# Patient Record
Sex: Female | Born: 1965 | Race: Black or African American | Hispanic: No | Marital: Married | Smoking: Never smoker
Health system: Southern US, Community
[De-identification: ages and names within clinical notes are randomized; demographics above are authoritative.]

## PROBLEM LIST (undated history)

## (undated) DIAGNOSIS — G459 Transient cerebral ischemic attack, unspecified: Secondary | ICD-10-CM

## (undated) DIAGNOSIS — R112 Nausea with vomiting, unspecified: Secondary | ICD-10-CM

## (undated) DIAGNOSIS — Z9889 Other specified postprocedural states: Secondary | ICD-10-CM

## (undated) HISTORY — PX: WISDOM TOOTH EXTRACTION: SHX21

## (undated) HISTORY — DX: Other specified postprocedural states: Z98.890

## (undated) HISTORY — DX: Nausea with vomiting, unspecified: R11.2

## (undated) HISTORY — PX: OTHER SURGICAL HISTORY: SHX169

---
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
## 1997-10-23 ENCOUNTER — Inpatient Hospital Stay (HOSPITAL_COMMUNITY): Admission: AD | Admit: 1997-10-23 | Discharge: 1997-10-25 | Payer: Self-pay | Admitting: Obstetrics and Gynecology

## 1997-12-03 ENCOUNTER — Other Ambulatory Visit: Admission: RE | Admit: 1997-12-03 | Discharge: 1997-12-03 | Payer: Self-pay | Admitting: *Deleted

## 1998-12-16 ENCOUNTER — Other Ambulatory Visit: Admission: RE | Admit: 1998-12-16 | Discharge: 1998-12-16 | Payer: Self-pay | Admitting: *Deleted

## 2000-01-16 ENCOUNTER — Encounter (INDEPENDENT_AMBULATORY_CARE_PROVIDER_SITE_OTHER): Payer: Self-pay

## 2000-01-16 ENCOUNTER — Inpatient Hospital Stay (HOSPITAL_COMMUNITY): Admission: AD | Admit: 2000-01-16 | Discharge: 2000-01-18 | Payer: Self-pay | Admitting: Obstetrics & Gynecology

## 2000-03-02 ENCOUNTER — Other Ambulatory Visit: Admission: RE | Admit: 2000-03-02 | Discharge: 2000-03-02 | Payer: Self-pay | Admitting: *Deleted

## 2001-05-04 ENCOUNTER — Other Ambulatory Visit: Admission: RE | Admit: 2001-05-04 | Discharge: 2001-05-04 | Payer: Self-pay | Admitting: *Deleted

## 2002-07-30 ENCOUNTER — Other Ambulatory Visit: Admission: RE | Admit: 2002-07-30 | Discharge: 2002-07-30 | Payer: Self-pay | Admitting: *Deleted

## 2002-08-08 HISTORY — PX: BREAST BIOPSY: SHX20

## 2003-05-23 ENCOUNTER — Encounter (INDEPENDENT_AMBULATORY_CARE_PROVIDER_SITE_OTHER): Payer: Self-pay | Admitting: *Deleted

## 2003-05-23 ENCOUNTER — Encounter: Admission: RE | Admit: 2003-05-23 | Discharge: 2003-05-23 | Payer: Self-pay | Admitting: *Deleted

## 2003-05-23 ENCOUNTER — Encounter: Payer: Self-pay | Admitting: *Deleted

## 2003-08-09 DIAGNOSIS — G459 Transient cerebral ischemic attack, unspecified: Secondary | ICD-10-CM

## 2003-08-09 HISTORY — DX: Transient cerebral ischemic attack, unspecified: G45.9

## 2003-08-18 ENCOUNTER — Other Ambulatory Visit: Admission: RE | Admit: 2003-08-18 | Discharge: 2003-08-18 | Payer: Self-pay | Admitting: *Deleted

## 2003-10-03 ENCOUNTER — Ambulatory Visit (HOSPITAL_COMMUNITY): Admission: RE | Admit: 2003-10-03 | Discharge: 2003-10-03 | Payer: Self-pay | Admitting: *Deleted

## 2003-10-27 ENCOUNTER — Encounter: Admission: RE | Admit: 2003-10-27 | Discharge: 2003-10-27 | Payer: Self-pay | Admitting: Infectious Diseases

## 2004-11-04 ENCOUNTER — Other Ambulatory Visit: Admission: RE | Admit: 2004-11-04 | Discharge: 2004-11-04 | Payer: Self-pay | Admitting: Obstetrics and Gynecology

## 2004-12-14 ENCOUNTER — Encounter: Admission: RE | Admit: 2004-12-14 | Discharge: 2004-12-14 | Payer: Self-pay | Admitting: Obstetrics and Gynecology

## 2004-12-17 ENCOUNTER — Encounter: Admission: RE | Admit: 2004-12-17 | Discharge: 2004-12-17 | Payer: Self-pay | Admitting: Obstetrics and Gynecology

## 2005-02-16 ENCOUNTER — Ambulatory Visit (HOSPITAL_COMMUNITY): Admission: RE | Admit: 2005-02-16 | Discharge: 2005-02-16 | Payer: Self-pay | Admitting: General Surgery

## 2005-02-16 ENCOUNTER — Encounter (INDEPENDENT_AMBULATORY_CARE_PROVIDER_SITE_OTHER): Payer: Self-pay | Admitting: *Deleted

## 2005-02-16 ENCOUNTER — Ambulatory Visit (HOSPITAL_BASED_OUTPATIENT_CLINIC_OR_DEPARTMENT_OTHER): Admission: RE | Admit: 2005-02-16 | Discharge: 2005-02-16 | Payer: Self-pay | Admitting: General Surgery

## 2006-03-17 ENCOUNTER — Encounter: Admission: RE | Admit: 2006-03-17 | Discharge: 2006-03-17 | Payer: Self-pay | Admitting: Obstetrics and Gynecology

## 2006-05-26 ENCOUNTER — Ambulatory Visit (HOSPITAL_COMMUNITY): Admission: RE | Admit: 2006-05-26 | Discharge: 2006-05-26 | Payer: Self-pay | Admitting: Obstetrics and Gynecology

## 2006-05-26 ENCOUNTER — Encounter (INDEPENDENT_AMBULATORY_CARE_PROVIDER_SITE_OTHER): Payer: Self-pay | Admitting: Specialist

## 2007-04-19 ENCOUNTER — Encounter: Admission: RE | Admit: 2007-04-19 | Discharge: 2007-04-19 | Payer: Self-pay | Admitting: Obstetrics and Gynecology

## 2007-08-09 HISTORY — PX: ABDOMINAL HYSTERECTOMY: SHX81

## 2007-12-24 ENCOUNTER — Ambulatory Visit (HOSPITAL_COMMUNITY): Admission: RE | Admit: 2007-12-24 | Discharge: 2007-12-25 | Payer: Self-pay | Admitting: Obstetrics and Gynecology

## 2007-12-24 ENCOUNTER — Encounter (INDEPENDENT_AMBULATORY_CARE_PROVIDER_SITE_OTHER): Payer: Self-pay | Admitting: Obstetrics and Gynecology

## 2008-02-22 ENCOUNTER — Ambulatory Visit (HOSPITAL_COMMUNITY): Admission: RE | Admit: 2008-02-22 | Discharge: 2008-02-22 | Payer: Self-pay | Admitting: Obstetrics and Gynecology

## 2008-08-14 IMAGING — MG MM SCREEN MAMMOGRAM BILATERAL
4 series · 4 of 4 positions shown · non-contrast
Comparison: none

DG SCREEN MAMMOGRAM BILATERAL
Bilateral CC and MLO view(s) were taken.

DIGITAL SCREENING MAMMOGRAM WITH CAD:
The breast tissue is heterogeneously dense.  No masses or malignant type calcifications are 
identified.  Compared with prior studies.

[R CC]
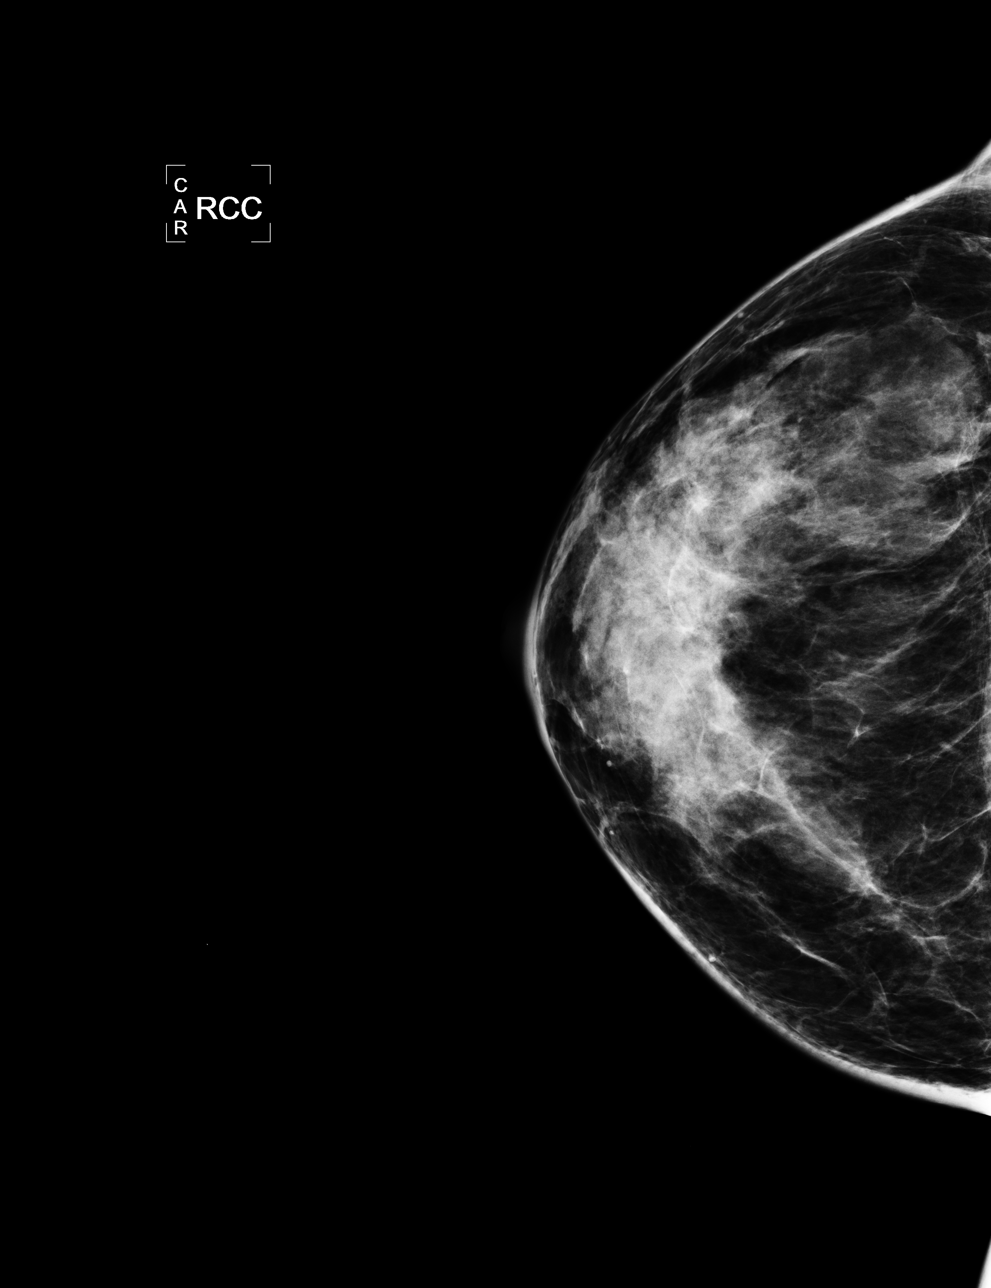

[L CC]
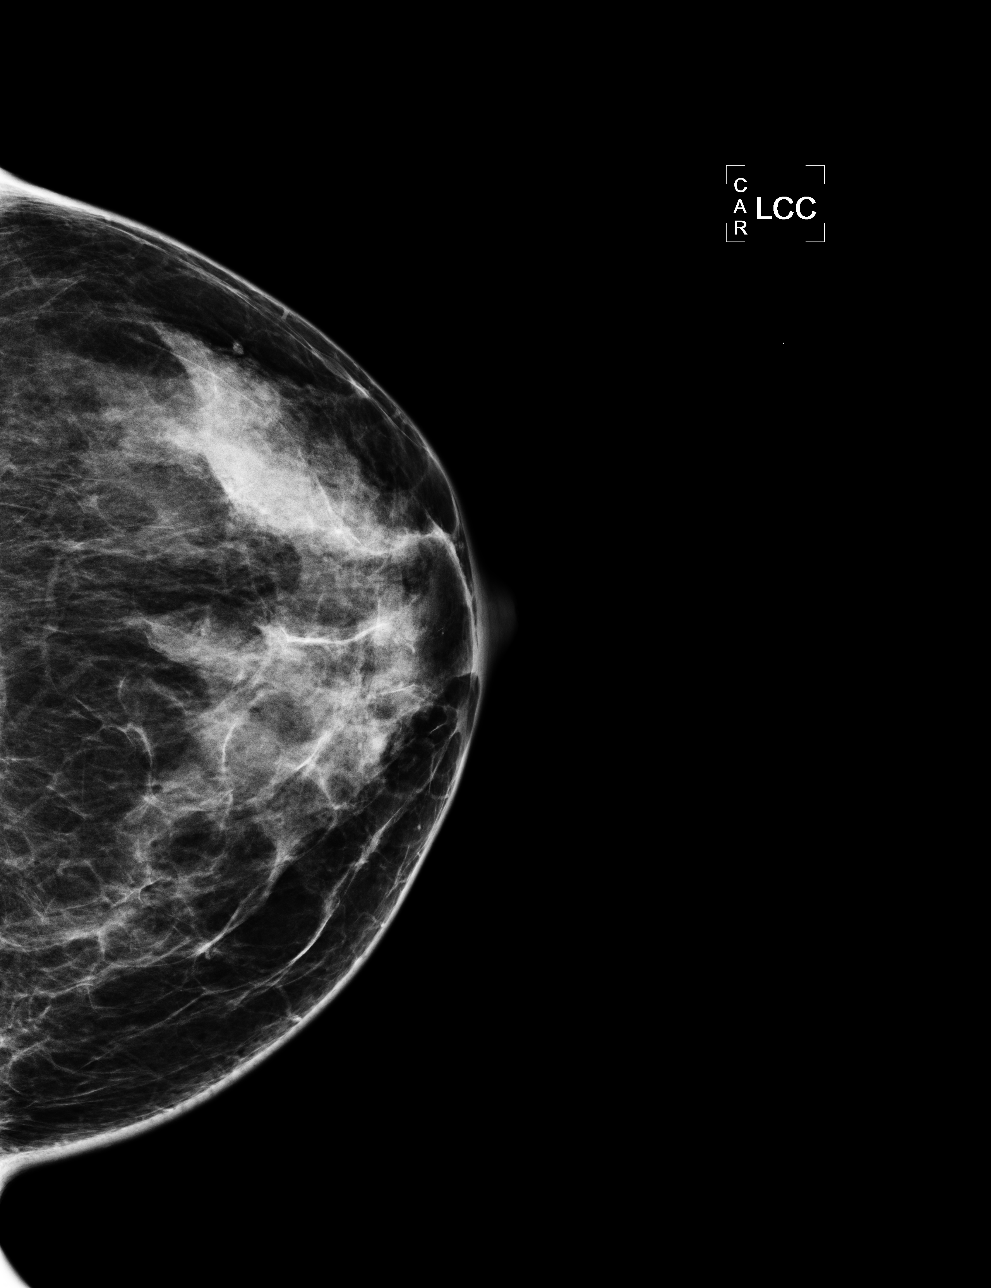

[L MLO]
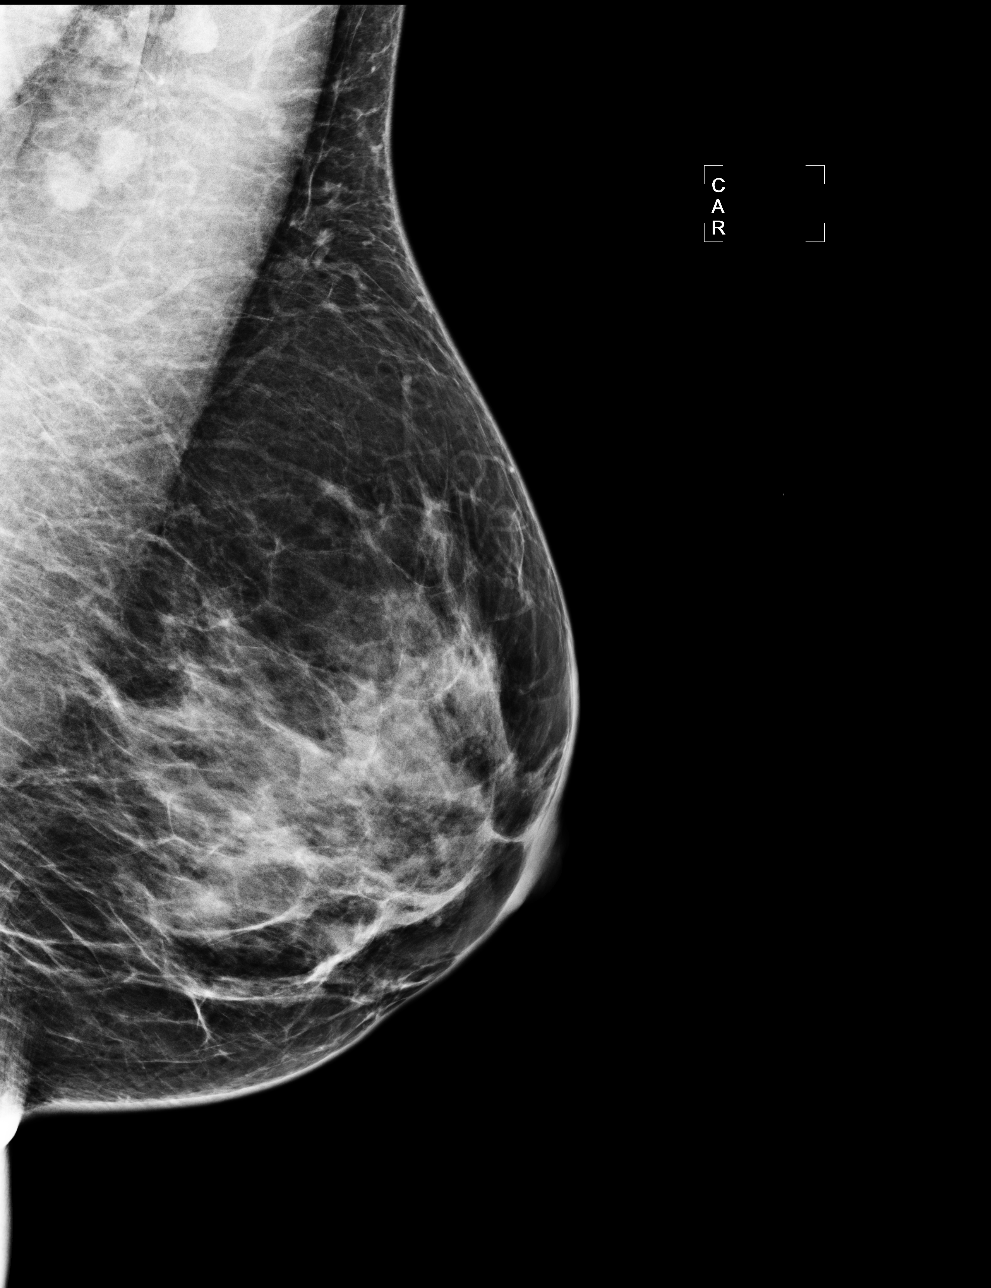

[R MLO]
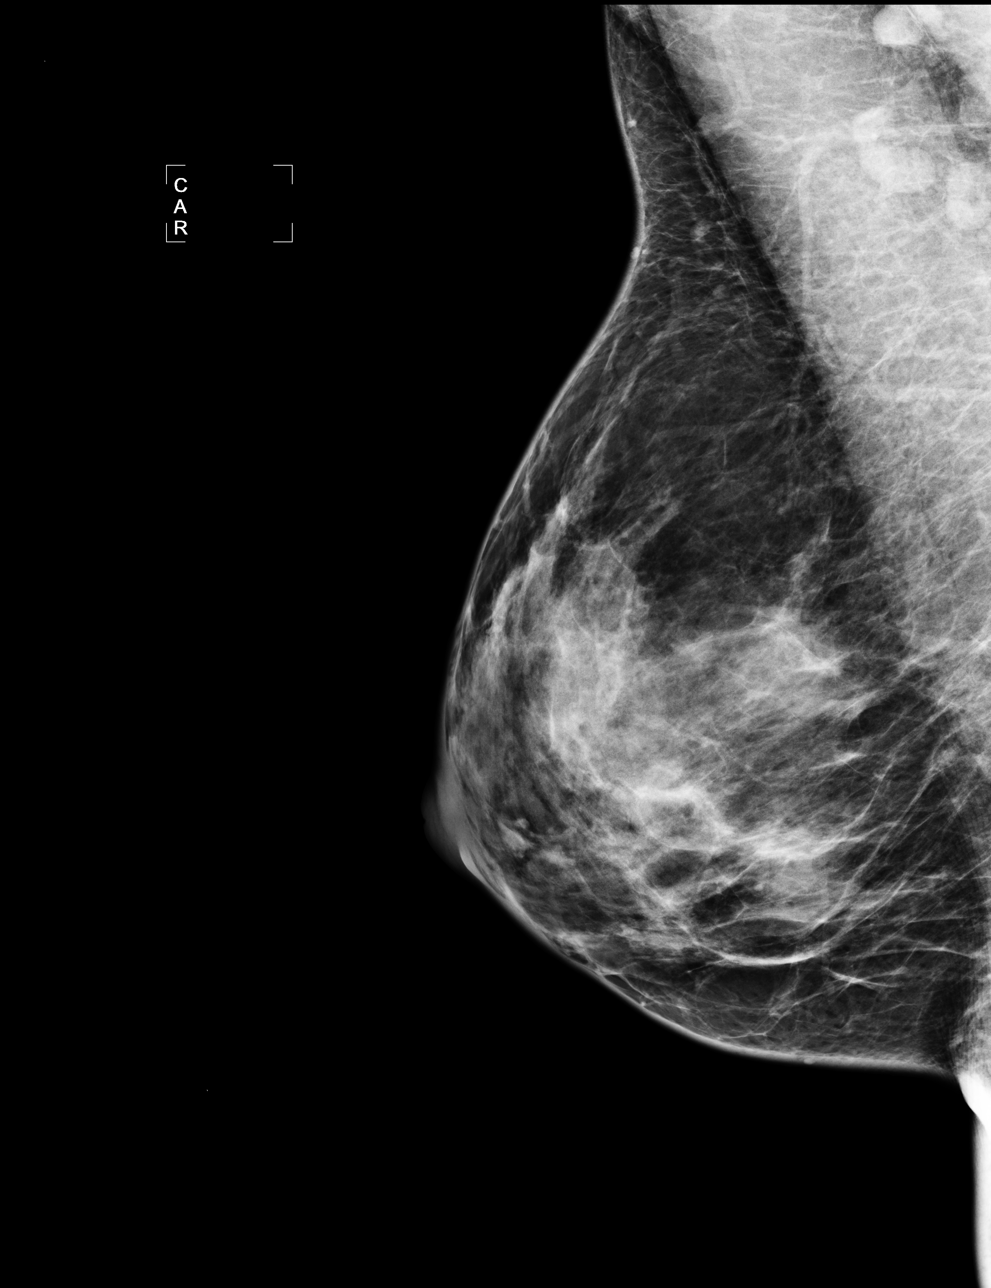

[4 of 4 positions shown; findings below may reference images not displayed]

IMPRESSION: No specific mammographic evidence of malignancy.  Next screening mammogram is recommended in one 
year.

ASSESSMENT: Negative - BI-RADS 1

Screening mammogram in 1 year.
ANALYZED BY COMPUTER AIDED DETECTION. , THIS PROCEDURE WAS A DIGITAL MAMMOGRAM.

## 2009-11-24 ENCOUNTER — Encounter: Admission: RE | Admit: 2009-11-24 | Discharge: 2009-11-24 | Payer: Self-pay | Admitting: Obstetrics and Gynecology

## 2009-12-04 ENCOUNTER — Encounter: Admission: RE | Admit: 2009-12-04 | Discharge: 2009-12-04 | Payer: Self-pay | Admitting: Obstetrics and Gynecology

## 2010-12-21 NOTE — Op Note (Signed)
Susan Underwood, Susan Underwood                 ACCOUNT NO.:  [id number]   MEDICAL RECORD NO.:  [id number]          PATIENT TYPE:  INP   LOCATION:  9318                          FACILITY:  [facility]   PHYSICIAN:  Michelle L. Grewal, M.D.DATE OF BIRTH:  Apr 02, 1966   DATE OF PROCEDURE:  12/24/2007  DATE OF DISCHARGE:                               OPERATIVE REPORT   PREOPERATIVE DIAGNOSES:  1. Symptomatic fibroids.  2. Menorrhagia.  3. Anemia.   POSTOPERATIVE DIAGNOSES:  1. Symptomatic fibroids.  2. Menorrhagia.  3. Anemia.   PROCEDURE:  Laparoscopic-assisted vaginal hysterectomy.   SURGEON:  [name], MD   ASSISTANT:  Guy Sandifer. Henderson Cloud, MD   ANESTHESIA:  General.   FINDINGS:  Enlarged fibroid uterus.   SPECIMENS:  Uterus and cervix sent to pathology.   ESTIMATED BLOOD LOSS:  300 mL.   COMPLICATIONS:  None.   PROCEDURE IN DETAIL:  The patient was taken to the operating room after  she was given informed consent on the risk associated with procedure  which included the risk of anesthesia, risk of bleeding, risk of  infection, risk of internal injury to bowel or bladder or surrounding  organs, risk of pulmonary embolism, thromboembolism.  After the patient  was intubated without difficulty, she was prepped and draped.  A Foley  catheter was inserted and draining clear urine.  A uterine manipulator  was inserted.  Attention was turned to the abdomen.  A small  infraumbilical incision was made after local was infiltrated and a  Veress needle was inserted without difficulty.  Pneumoperitoneum was  performed.  The Veress needle was removed and a 11-mm trocar was  inserted.  The laparoscope was introduced into the abdominal cavity.  No  intestinal injury was noted and no bleeding was noted.  The patient was  gently placed in Trendelenburg position and a 5-mm suprapubic trocar was  inserted.  Exam of the pelvis revealed uterus was markedly enlarged with  a large 7-cm fibroid.  The  ovaries appeared normal.  I then identified  the right tube and ovary, grasped it with an atraumatic grasper,  identified the triple pedicle, placed a gyrus across the triple pedicle,  burned on the right side and carried down through to the round ligament  with excellent hemostasis.  The ureter was well below where the gyrus  was and was peristalsing normally.  This was done in identical fashion  on the left side of the pelvis as well.  The cul-de-sac was clear.  There was no problem in the cul-de-sac.  No adhesions.  The  pneumoperitoneum was released.  The trocars were kept in and then we  turned our attention vaginally.  A weighted speculum was placed in the  vagina.  The cervix was grasped with a tenaculum.  A circumferential  incision was made around the cervix using the Bovie and the posterior  cul-de-sac was entered sharply using Mayo scissors.  A weighted speculum  was placed in the cul-de-sac.  The anterior cul-de-sac was entered  sharply using Mayo scissors.  Using curved Heaney clamps, the  uterosacral  cardinal ligaments were clamped on either side.  Each  pedicle was suture ligated using 0 Vicryl suture.  We walked our way up  to the broad ligament but careful to stay just beside the uterus.  Each  pedicle was suture ligated using 0 Vicryl suture.  Because of the  immense size of the fibroid, once we got almost to the fundus we tried  to deliver the uterus, however, it was too large.  So at this point, I  used the scalpel and the corded out the uterus and removed the large 7  cm fibroid.  When I did do that, the remainder of the uterus came out  quite easily and I clamped the remainder of the broad ligament on the  right with curved Heaney clamps.  The specimen was removed.  The pedicle  was secured using a suture ligature of 0 Vicryl suture.  No bleeding was  noted whatsoever.  The posterior cuff was closed using from 3 to 9  o'clock using 0 Vicryl suture in a running locked  stitch.  We then  closed the cuff completely noting that the peritoneal cavity was  completely dry and using 0 Vicryl anterior to posterior.  At this point,  I went back up to the abdomen after I changed my gloves.  I performed  the pneumoperitoneum again.  The pelvis was gently irrigated using  Nezhat irrigator.  The entire pelvis was clean.  The sigmoid colon,  however, did seemed like it wanted to rest against the vaginal cuff.  So, I did place one small piece of Interceed across the vaginal cuff to  hopefully prevent adhesion formation in the sigmoid colon to the vaginal  cuff.  The cuff was completely dry.  The trocars were removed.  The skin  was closed with Dermabond skin adhesive.  All sponge, lap, and  instrument counts were correct x2.  The patient went to the recovery  room in stable condition.      [name], M.D.  Electronically Signed     MLG/MEDQ  D:  12/24/2007  T:  12/24/2007  Job:  [id number]

## 2010-12-24 NOTE — Op Note (Signed)
[name], [name]                 ACCOUNT NO.:  [id number]   MEDICAL RECORD NO.:  [id number]          PATIENT TYPE:  AMB   LOCATION:  SDC                           FACILITY:  [facility]   PHYSICIAN:  Michelle L. Grewal, M.D.DATE OF BIRTH:  12/21/1965   DATE OF PROCEDURE:  05/26/2006  DATE OF DISCHARGE:                                 OPERATIVE REPORT   PREOPERATIVE DIAGNOSIS:  Endometrial polyp and menorrhagia.   POSTOPERATIVE DIAGNOSES:  Endometrial polyp and menorrhagia.  Submucosal  fibroid.   PROCEDURE:  Diagnostic hysteroscopy, dilatation and curettage.   SURGEON:  Dr. Vincente Poli   ANESTHESIA:  MAC with local.   SPECIMENS:  Uterine curettings.   ESTIMATED BLOOD LOSS:  Minimal.   COMPLICATIONS:  None.   PROCEDURE:  The patient is taken to the operating room.  She is given  sedation.  She is placed in the lithotomy position.  She is prepped and  draped in the usual sterile fashion.  In-and-out catheter is used to empty  the bladder.  Speculum is inserted to the vagina after sterile drape is  applied and cervix is grasped with a tenaculum.  A paracervical block is  performed in standard fashion.  The cervical internal os is dilated.  The  diagnostic hysteroscope inserted to the uterus and with excellent  visualization wee were able to see the following.  She did have some  polypoid area in the uterus, however, the anterior surface of the uterine  cavity was smooth.  Both tubal ostia were seen.  The entire back wall of the  uterus I\was encompassed by the submucosal fibroids that actually bulged  into the uterine cavity.  It was much too large to be resected by  hysteroscopy.  I removed the hysteroscope and then performed a sharp uterine  curettage and then used polyp forceps to remove the remainder of the tissue.  All tissue was sent to pathology for analysis.  All instruments were removed  from the vagina.  All sponge, lap and instrument counts were correct x2.  The patient went  to recovery room in stable condition.      [name], M.D.  Electronically Signed     MLG/MEDQ  D:  05/26/2006  T:  05/28/2006  Job:  [id number]

## 2010-12-24 NOTE — Op Note (Signed)
NAMEIDALIA, ALLBRITTON                 ACCOUNT NO.:  [id number]   MEDICAL RECORD NO.:  [id number]          PATIENT TYPE:  AMB   LOCATION:  DSC                          FACILITY:  [facility]   PHYSICIAN:  Rose Phi. Maple Hudson, M.D.   DATE OF BIRTH:  [birth year]   DATE OF PROCEDURE:  02/16/2005  DATE OF DISCHARGE:                                 OPERATIVE REPORT   PREOPERATIVE DIAGNOSIS:  Right breast mass.   POSTOPERATIVE DIAGNOSIS:  Right breast mass.   OPERATION PERFORMED:  Excision of right breast mass.   SURGEON:  Rose Phi. Maple Hudson, M.D.   ANESTHESIA:  MAC.   DESCRIPTION OF PROCEDURE:  The patient was placed on the operating table  with the arms extended on the arm board and the right breast prepped and  draped in the usual fashion.  The palpable mass was at about the 7 o'clock  position of the right breast near the areolar margin.  A little transverse  incision was outlined over it and the area infiltrated with a local  anesthetic mixture.  The incision was made and I could palpate the lumb and  so we excised it with a little surrounding breast tissue.  Hemostasis was  obtained with the cautery.  Subcuticular closure with 4-0 Monocryl and Steri-  Strips carried out.  Specimens sent to the pathology lab.   A dressing was applied and the patient transferred to the recovery room in  satisfactory condition having tolerated the procedure well.       PRY/MEDQ  D:  02/16/2005  T:  02/16/2005  Job:  [id number]

## 2010-12-28 ENCOUNTER — Other Ambulatory Visit: Payer: Self-pay | Admitting: Obstetrics and Gynecology

## 2010-12-28 DIAGNOSIS — Z1231 Encounter for screening mammogram for malignant neoplasm of breast: Secondary | ICD-10-CM

## 2010-12-31 ENCOUNTER — Ambulatory Visit
Admission: RE | Admit: 2010-12-31 | Discharge: 2010-12-31 | Disposition: A | Discharge status: Home / Self Care | Payer: BC Managed Care – PPO | Source: Ambulatory Visit | Attending: Obstetrics and Gynecology | Admitting: Obstetrics and Gynecology

## 2010-12-31 ENCOUNTER — Other Ambulatory Visit: Payer: Self-pay | Admitting: Obstetrics and Gynecology

## 2010-12-31 DIAGNOSIS — N63 Unspecified lump in unspecified breast: Secondary | ICD-10-CM

## 2010-12-31 DIAGNOSIS — Z1231 Encounter for screening mammogram for malignant neoplasm of breast: Secondary | ICD-10-CM

## 2011-01-07 ENCOUNTER — Other Ambulatory Visit: Payer: Self-pay | Admitting: Obstetrics and Gynecology

## 2011-01-07 ENCOUNTER — Ambulatory Visit
Admission: RE | Admit: 2011-01-07 | Discharge: 2011-01-07 | Disposition: A | Discharge status: Home / Self Care | Payer: BC Managed Care – PPO | Source: Ambulatory Visit | Attending: Obstetrics and Gynecology | Admitting: Obstetrics and Gynecology

## 2011-01-07 DIAGNOSIS — N63 Unspecified lump in unspecified breast: Secondary | ICD-10-CM

## 2011-04-01 IMAGING — MG MM DIGITAL DIAG LTD R
2 series · 2 of 2 positions shown · non-contrast
Comparison: [DATE] [DATE], [DATE], [DATE] [DATE], [DATE]

CLINICAL DATA: Called back from screening mammogram for possible
mass right breast

DIGITAL DIAGNOSTIC RIGHT MAMMOGRAM December 04, 2009 AND RIGHT BREAST
ULTRASOUND:

[R CC]
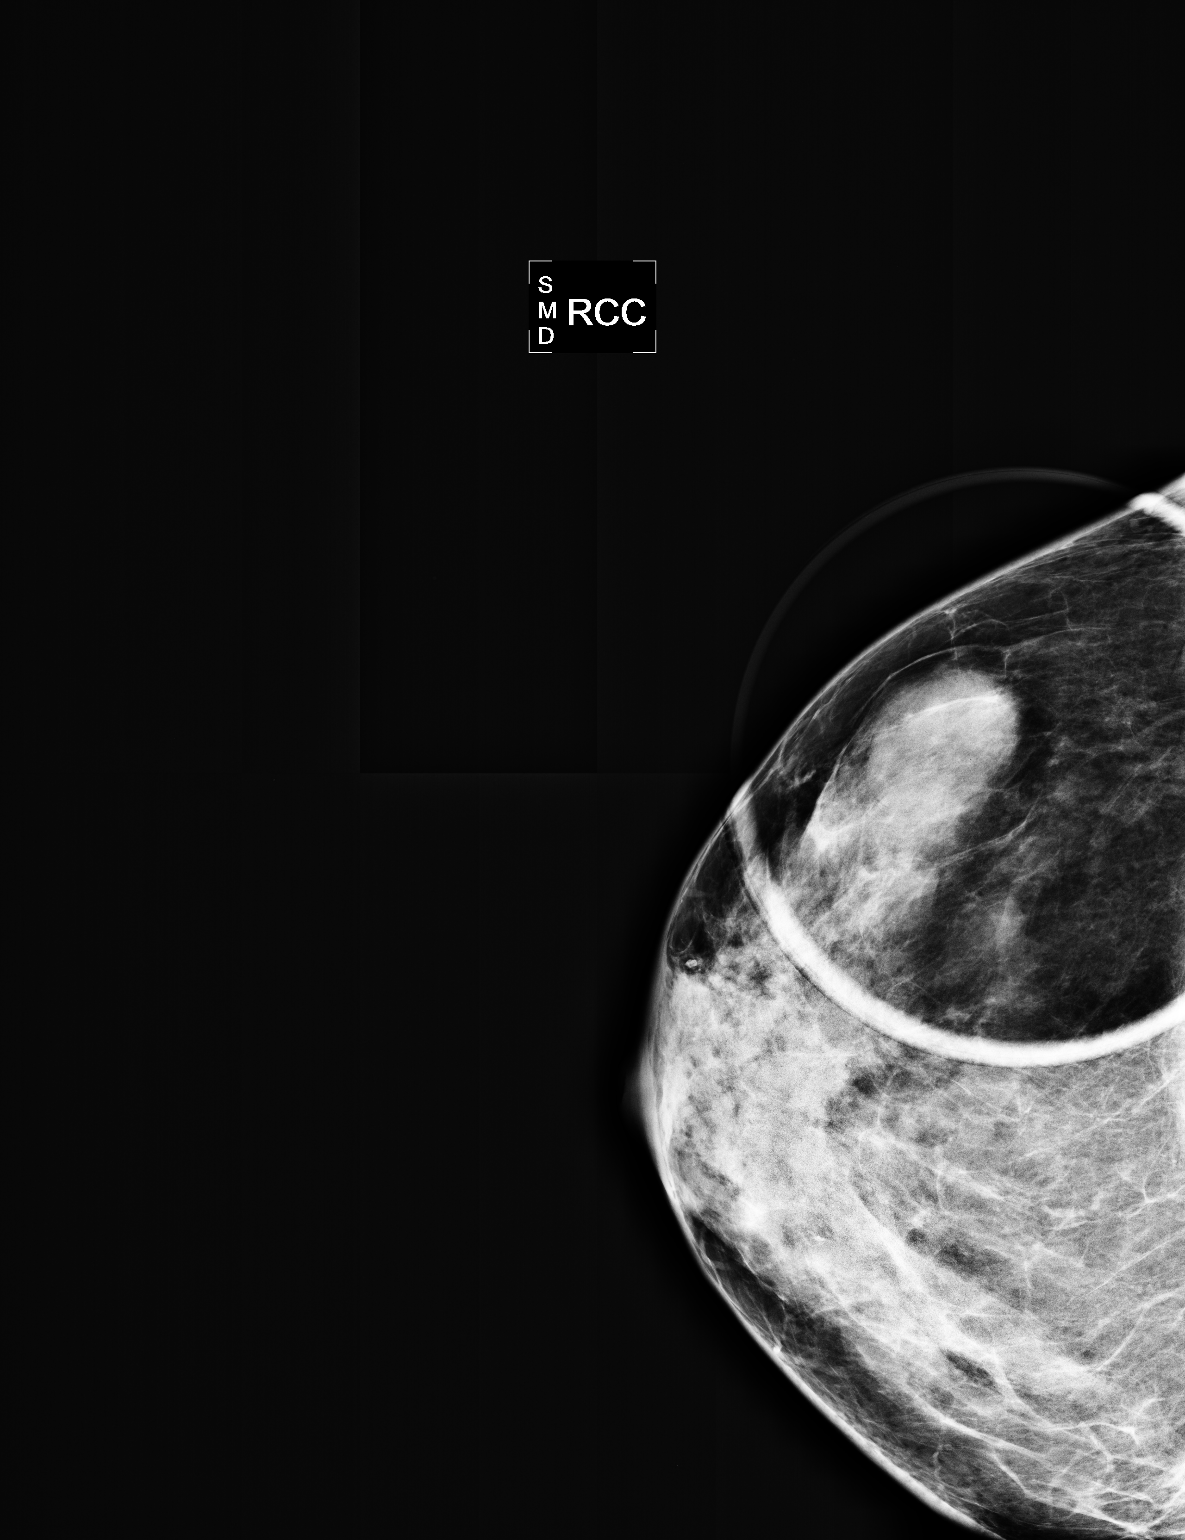

[R MLO]
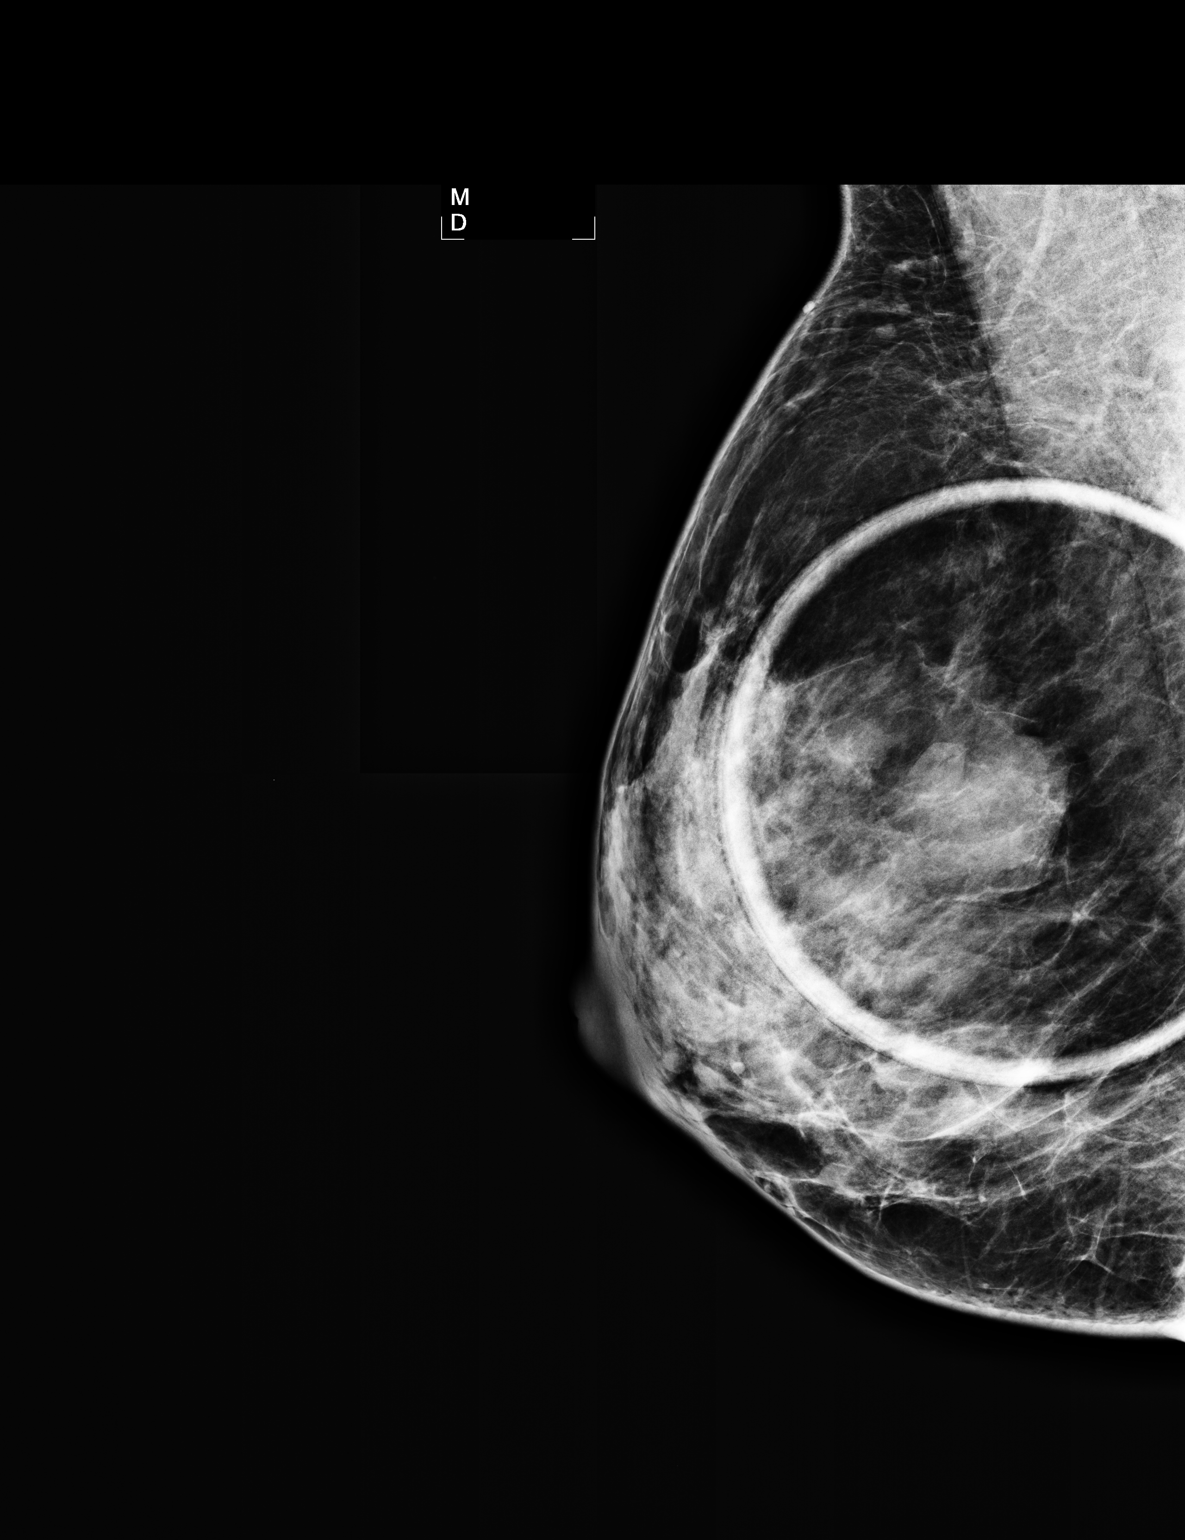

[2 of 2 positions shown; findings below may reference images not displayed]

FINDINGS: Spot compression CC and MLO views of the right breast
are submitted.  The previously questioned mass in the right breast
persists; there is a small adjacent asymmetry.

On physical exam, I palpate a 2 cm mass at the right breast nine
o'clock position 3 cm from the nipple.

Ultrasound is performed, showing two adjacent simple cysts at the
right breast nine o'clock position; the larger measures 2 cm in
maximum dimension.  These correlate to the mammographic finding.
IMPRESSION: Benign findings, recommend routine screening mammogram back on
schedule

BI-RADS CATEGORY 2:  Benign finding(s).

## 2011-04-01 IMAGING — US UNKNOWN US STUDY
1 series · 4 of 4 positions shown · non-contrast
Comparison: [DATE] [DATE], [DATE], [DATE] [DATE], [DATE]

CLINICAL DATA: Called back from screening mammogram for possible
mass right breast

DIGITAL DIAGNOSTIC RIGHT MAMMOGRAM December 04, 2009 AND RIGHT BREAST
ULTRASOUND:

[Series 1: unknown us study · 4 of 4 slices shown]
[im 1/4]
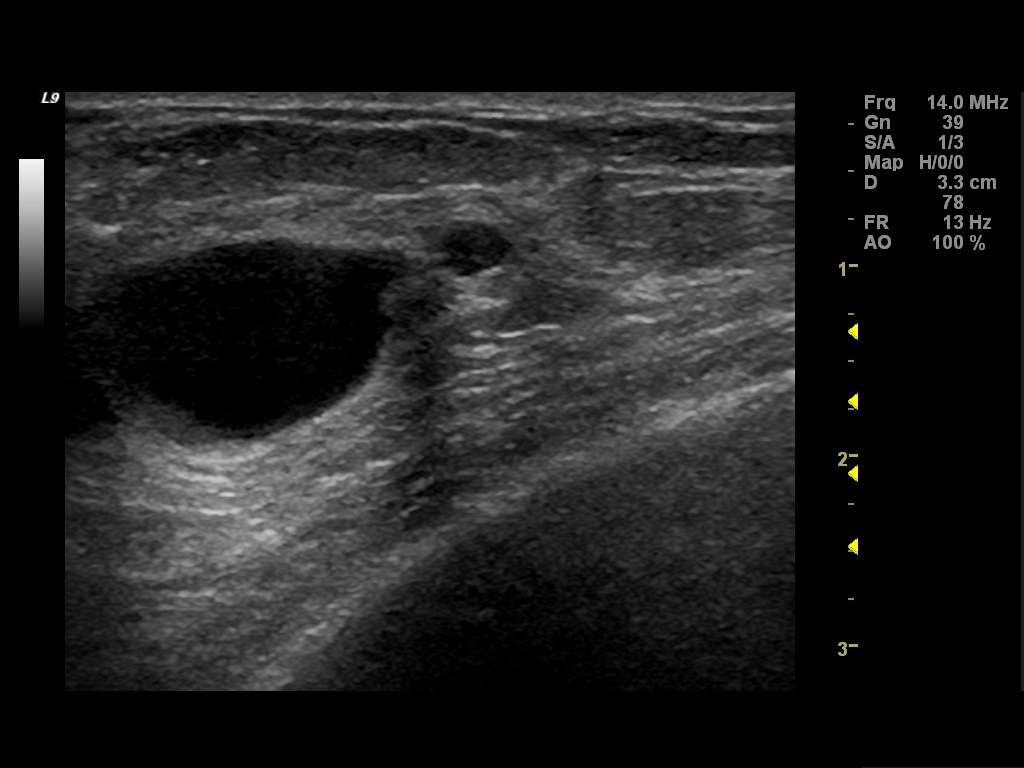
[im 2/4]
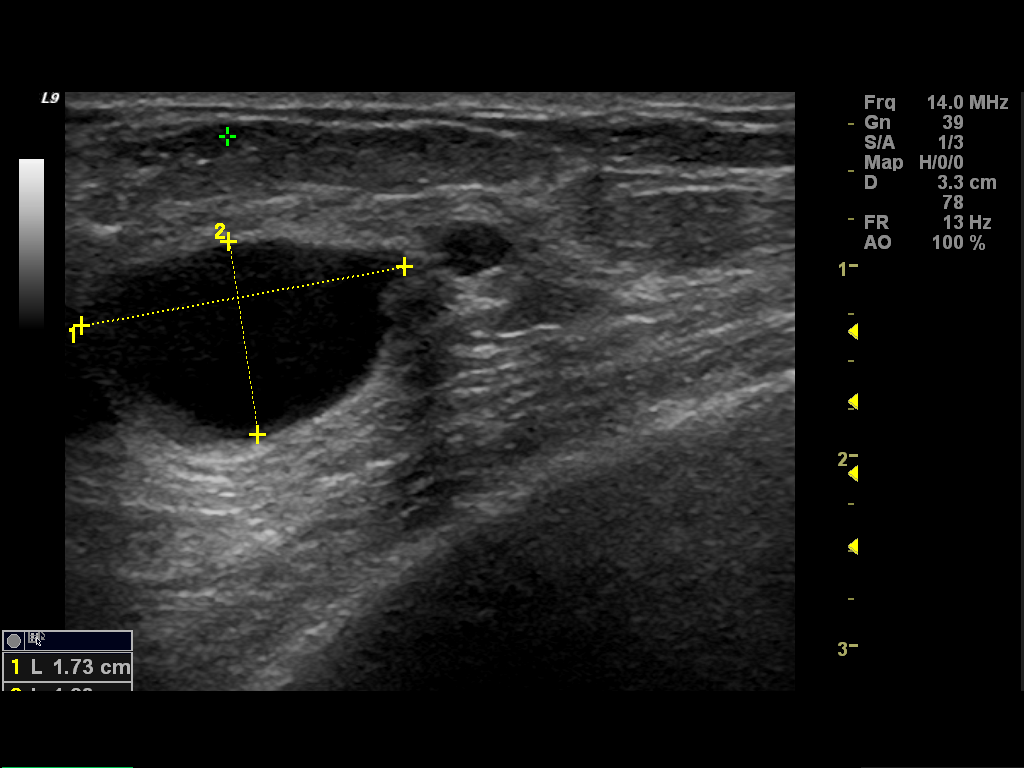
[im 3/4]
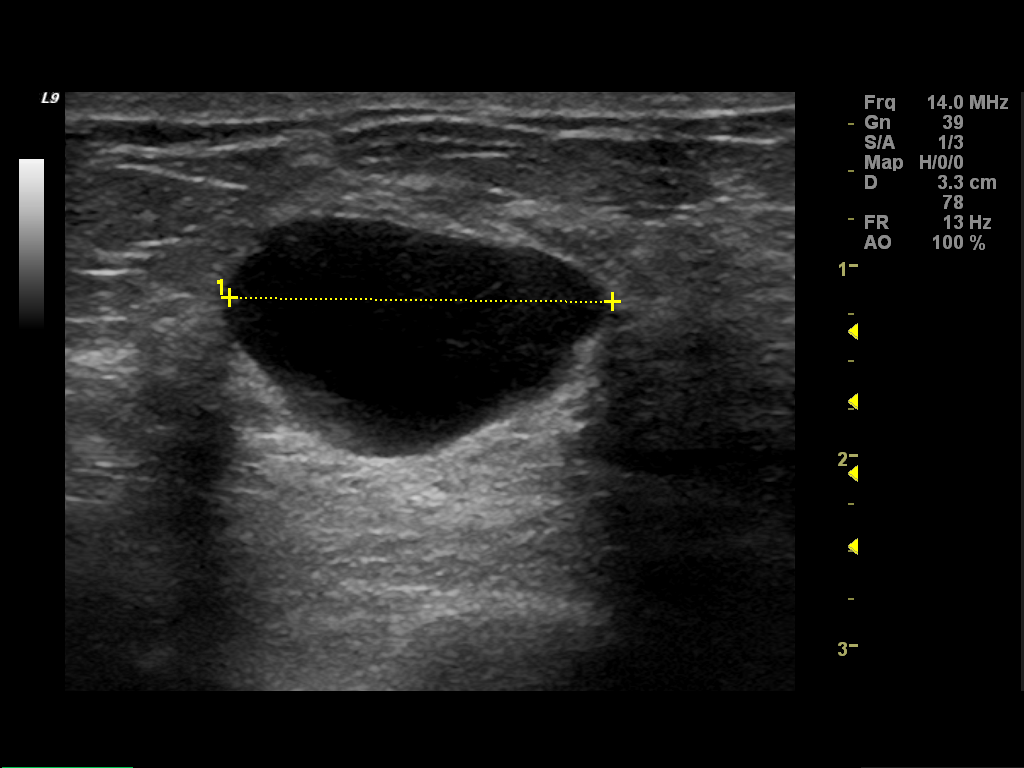
[im 4/4]
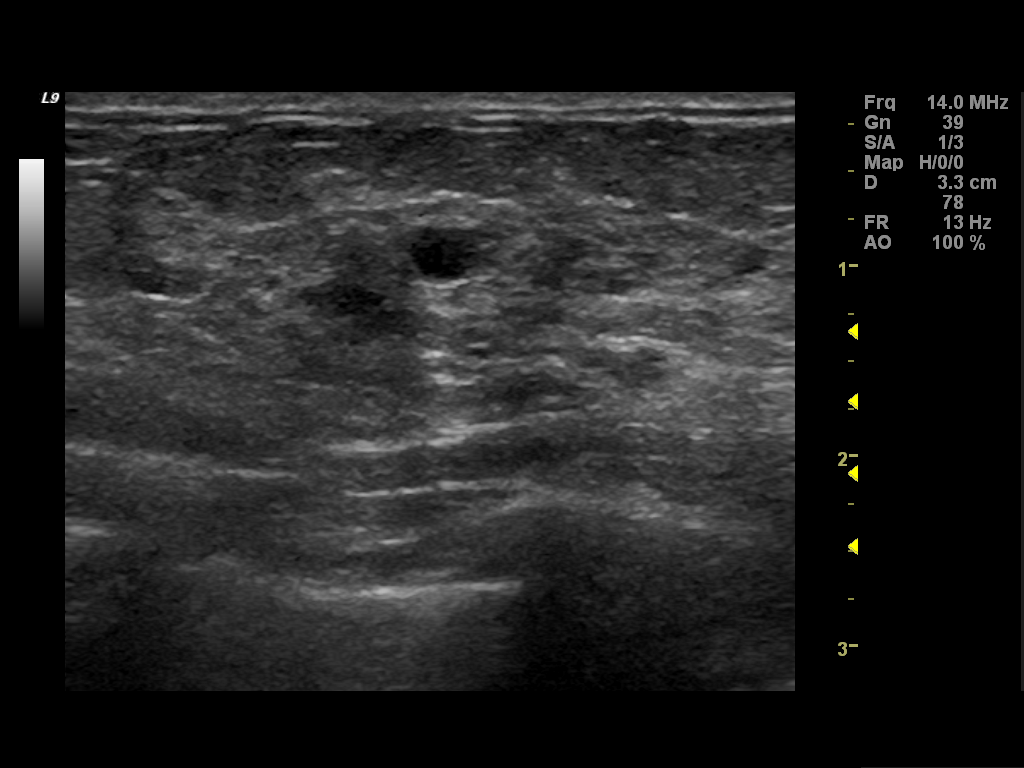

[4 of 4 positions shown; findings below may reference images not displayed]

FINDINGS: Spot compression CC and MLO views of the right breast
are submitted.  The previously questioned mass in the right breast
persists; there is a small adjacent asymmetry.

On physical exam, I palpate a 2 cm mass at the right breast nine
o'clock position 3 cm from the nipple.

Ultrasound is performed, showing two adjacent simple cysts at the
right breast nine o'clock position; the larger measures 2 cm in
maximum dimension.  These correlate to the mammographic finding.
IMPRESSION: Benign findings, recommend routine screening mammogram back on
schedule

BI-RADS CATEGORY 2:  Benign finding(s).

## 2011-05-04 LAB — CBC
HCT: 27.8 — ABNORMAL LOW
Hemoglobin: 9.3 — ABNORMAL LOW
MCHC: 33.6
Platelets: 190
RBC: 3.2 — ABNORMAL LOW
WBC: 9.4

## 2012-01-16 ENCOUNTER — Other Ambulatory Visit: Payer: Self-pay | Admitting: Obstetrics and Gynecology

## 2012-01-16 DIAGNOSIS — Z1231 Encounter for screening mammogram for malignant neoplasm of breast: Secondary | ICD-10-CM

## 2012-01-20 ENCOUNTER — Ambulatory Visit
Admission: RE | Admit: 2012-01-20 | Discharge: 2012-01-20 | Disposition: A | Discharge status: Home / Self Care | Payer: BC Managed Care – PPO | Source: Ambulatory Visit | Attending: Obstetrics and Gynecology | Admitting: Obstetrics and Gynecology

## 2012-01-20 DIAGNOSIS — Z1231 Encounter for screening mammogram for malignant neoplasm of breast: Secondary | ICD-10-CM

## 2013-03-27 ENCOUNTER — Other Ambulatory Visit: Payer: Self-pay

## 2013-03-27 DIAGNOSIS — Z1231 Encounter for screening mammogram for malignant neoplasm of breast: Secondary | ICD-10-CM

## 2013-04-26 ENCOUNTER — Ambulatory Visit
Admission: RE | Admit: 2013-04-26 | Discharge: 2013-04-26 | Disposition: A | Discharge status: Home / Self Care | Payer: BC Managed Care – PPO | Source: Ambulatory Visit

## 2013-04-26 DIAGNOSIS — Z1231 Encounter for screening mammogram for malignant neoplasm of breast: Secondary | ICD-10-CM

## 2013-05-10 ENCOUNTER — Other Ambulatory Visit: Payer: Self-pay | Admitting: Obstetrics and Gynecology

## 2013-05-10 DIAGNOSIS — N63 Unspecified lump in unspecified breast: Secondary | ICD-10-CM

## 2013-05-24 ENCOUNTER — Ambulatory Visit
Admission: RE | Admit: 2013-05-24 | Discharge: 2013-05-24 | Disposition: A | Discharge status: Home / Self Care | Payer: BC Managed Care – PPO | Source: Ambulatory Visit | Attending: Obstetrics and Gynecology | Admitting: Obstetrics and Gynecology

## 2013-05-24 DIAGNOSIS — N63 Unspecified lump in unspecified breast: Secondary | ICD-10-CM

## 2013-07-26 ENCOUNTER — Other Ambulatory Visit: Payer: Self-pay | Admitting: Obstetrics and Gynecology

## 2013-07-29 ENCOUNTER — Other Ambulatory Visit: Payer: Self-pay | Admitting: Obstetrics and Gynecology

## 2013-07-29 DIAGNOSIS — N6001 Solitary cyst of right breast: Secondary | ICD-10-CM

## 2013-08-06 ENCOUNTER — Ambulatory Visit
Admission: RE | Admit: 2013-08-06 | Discharge: 2013-08-06 | Disposition: A | Discharge status: Home / Self Care | Payer: BC Managed Care – PPO | Source: Ambulatory Visit | Attending: Obstetrics and Gynecology | Admitting: Obstetrics and Gynecology

## 2013-08-06 DIAGNOSIS — N6001 Solitary cyst of right breast: Secondary | ICD-10-CM

## 2014-06-03 ENCOUNTER — Other Ambulatory Visit: Payer: Self-pay

## 2014-06-03 DIAGNOSIS — Z1231 Encounter for screening mammogram for malignant neoplasm of breast: Secondary | ICD-10-CM

## 2014-06-06 ENCOUNTER — Other Ambulatory Visit: Payer: Self-pay | Admitting: Obstetrics and Gynecology

## 2014-06-06 ENCOUNTER — Ambulatory Visit
Admission: RE | Admit: 2014-06-06 | Discharge: 2014-06-06 | Disposition: A | Discharge status: Home / Self Care | Payer: BC Managed Care – PPO | Source: Ambulatory Visit

## 2014-06-06 ENCOUNTER — Encounter (INDEPENDENT_AMBULATORY_CARE_PROVIDER_SITE_OTHER): Payer: Self-pay

## 2014-06-06 DIAGNOSIS — N63 Unspecified lump in unspecified breast: Secondary | ICD-10-CM

## 2014-06-06 DIAGNOSIS — Z1231 Encounter for screening mammogram for malignant neoplasm of breast: Secondary | ICD-10-CM

## 2014-06-11 ENCOUNTER — Other Ambulatory Visit: Payer: Self-pay | Admitting: Obstetrics and Gynecology

## 2014-06-11 DIAGNOSIS — N63 Unspecified lump in unspecified breast: Secondary | ICD-10-CM

## 2014-06-27 ENCOUNTER — Ambulatory Visit
Admission: RE | Admit: 2014-06-27 | Discharge: 2014-06-27 | Disposition: A | Discharge status: Home / Self Care | Payer: BC Managed Care – PPO | Source: Ambulatory Visit | Attending: Obstetrics and Gynecology | Admitting: Obstetrics and Gynecology

## 2014-06-27 DIAGNOSIS — N63 Unspecified lump in unspecified breast: Secondary | ICD-10-CM

## 2014-07-28 ENCOUNTER — Other Ambulatory Visit: Payer: Self-pay | Admitting: Obstetrics and Gynecology

## 2014-07-29 LAB — CYTOLOGY - PAP

## 2015-08-04 ENCOUNTER — Other Ambulatory Visit: Payer: Self-pay

## 2015-08-04 DIAGNOSIS — Z1231 Encounter for screening mammogram for malignant neoplasm of breast: Secondary | ICD-10-CM

## 2016-08-08 HISTORY — PX: EYE SURGERY: SHX253

## 2016-09-09 ENCOUNTER — Other Ambulatory Visit: Payer: Self-pay | Admitting: Obstetrics and Gynecology

## 2016-09-09 ENCOUNTER — Ambulatory Visit
Admission: RE | Admit: 2016-09-09 | Discharge: 2016-09-09 | Disposition: A | Discharge status: Home / Self Care | Payer: BLUE CROSS/BLUE SHIELD | Source: Ambulatory Visit | Attending: Obstetrics and Gynecology | Admitting: Obstetrics and Gynecology

## 2016-09-09 ENCOUNTER — Other Ambulatory Visit: Payer: Self-pay | Admitting: *Deleted

## 2016-09-09 DIAGNOSIS — H15091 Other scleritis, right eye: Secondary | ICD-10-CM

## 2018-12-25 ENCOUNTER — Encounter: Payer: Self-pay | Admitting: Gastroenterology

## 2019-01-04 ENCOUNTER — Ambulatory Visit: Payer: BLUE CROSS/BLUE SHIELD | Admitting: *Deleted

## 2019-01-04 ENCOUNTER — Other Ambulatory Visit: Payer: Self-pay

## 2019-01-04 ENCOUNTER — Encounter: Payer: Self-pay | Admitting: Gastroenterology

## 2019-01-04 VITALS — Ht 64.0 in | Wt 172.0 lb

## 2019-01-04 DIAGNOSIS — Z1211 Encounter for screening for malignant neoplasm of colon: Secondary | ICD-10-CM

## 2019-01-04 MED ORDER — PEG 3350-KCL-NA BICARB-NACL 420 G PO SOLR: 4000.0000 mL | Freq: Once | ORAL | 0 refills | Status: AC

## 2019-01-04 NOTE — Progress Notes (Signed)
HX PONV--No egg or soy allergy known to patient  No issues with past sedation with any surgeries  or procedures, no intubation problems  No diet pills per patient No home 02 use per patient  No blood thinners per patient  Pt denies issues with constipation  No A fib or A flutter  EMMI video sent to pt's e mail    Pt verified name, DOB, address and insurance during PV today. Pt mailed instruction packet to included paper to complete and mail back to [facility] with addressed and stamped envelope, Emmi video, copy of consent form to read and not return, and instructions.  PV completed over the phone. Pt encouraged to call with questions or issues

## 2019-01-16 ENCOUNTER — Telehealth: Payer: Self-pay | Admitting: *Deleted

## 2019-01-16 NOTE — Telephone Encounter (Signed)

## 2019-01-18 ENCOUNTER — Encounter: Payer: Self-pay | Admitting: Gastroenterology

## 2019-01-18 ENCOUNTER — Other Ambulatory Visit: Payer: Self-pay

## 2019-01-18 ENCOUNTER — Ambulatory Visit (AMBULATORY_SURGERY_CENTER): Payer: BC Managed Care – PPO | Admitting: Gastroenterology

## 2019-01-18 VITALS — BP 138/80 | HR 61 | Temp 97.3°F | Resp 11 | Ht 64.0 in | Wt 172.0 lb

## 2019-01-18 DIAGNOSIS — Z1211 Encounter for screening for malignant neoplasm of colon: Secondary | ICD-10-CM | POA: Diagnosis present

## 2019-01-18 MED ORDER — SODIUM CHLORIDE 0.9 % IV SOLN: 500.0000 mL | Freq: Once | INTRAVENOUS | Status: DC

## 2019-01-18 NOTE — Patient Instructions (Signed)
YOU HAD AN ENDOSCOPIC PROCEDURE TODAY AT THE [facility]:   Refer to the procedure report that was given to you for any specific questions about what was found during the examination.  If the procedure report does not answer your questions, please call your gastroenterologist to clarify.  If you requested that your care partner not be given the details of your procedure findings, then the procedure report has been included in a sealed envelope for you to review at your convenience later.  YOU SHOULD EXPECT: Some feelings of bloating in the abdomen. Passage of more gas than usual.  Walking can help get rid of the air that was put into your GI tract during the procedure and reduce the bloating. If you had a lower endoscopy (such as a colonoscopy or flexible sigmoidoscopy) you may notice spotting of blood in your stool or on the toilet paper. If you underwent a bowel prep for your procedure, you may not have a normal bowel movement for a few days.  Please Note:  You might notice some irritation and congestion in your nose or some drainage.  This is from the oxygen used during your procedure.  There is no need for concern and it should clear up in a day or so.  SYMPTOMS TO REPORT IMMEDIATELY:   Following lower endoscopy (colonoscopy or flexible sigmoidoscopy):  Excessive amounts of blood in the stool  Significant tenderness or worsening of abdominal pains  Swelling of the abdomen that is new, acute  Fever of 100F or higher  For urgent or emergent issues, a gastroenterologist can be reached at any hour by calling [phone].   DIET:  We do recommend a small meal at first, but then you may proceed to your regular diet.  Drink plenty of fluids but you should avoid alcoholic beverages for 24 hours.  ACTIVITY:  You should plan to take it easy for the rest of today and you should NOT DRIVE or use heavy machinery until tomorrow (because of the sedation medicines used during the test).     FOLLOW UP: Our staff will call the number listed on your records 48-72 hours following your procedure to check on you and address any questions or concerns that you may have regarding the information given to you following your procedure. If we do not reach you, we will leave a message.  We will attempt to reach you two times.  During this call, we will ask if you have developed any symptoms of COVID 19. If you develop any symptoms (ie: fever, flu-like symptoms, shortness of breath, cough etc.) before then, please call [phone].  If you test positive for Covid 19 in the 2 weeks post procedure, please call and report this information to us.    If any biopsies were taken you will be contacted by phone or by letter within the next 1-3 weeks.  Please call us at [phone] if you have not heard about the biopsies in 3 weeks.    SIGNATURES/CONFIDENTIALITY: You and/or your care partner have signed paperwork which will be entered into your electronic medical record.  These signatures attest to the fact that that the information above on your After Visit Summary has been reviewed and is understood.  Full responsibility of the confidentiality of this discharge information lies with you and/or your care-partner. 

## 2019-01-18 NOTE — Progress Notes (Signed)
Pt's states no medical or surgical changes since previsit or office visit. 

## 2019-01-18 NOTE — Progress Notes (Signed)
Nancy Campbell, LPN- Temp Judy Branson, CMA- Vitals 

## 2019-01-18 NOTE — Progress Notes (Signed)
Report to PACU, RN, vss, BBS= Clear.  

## 2019-01-18 NOTE — Op Note (Signed)
[facility] Patient Name: Susan Underwood Procedure Date: 01/18/2019 9:54 AM MRN: [id number] Endoscopist: Milus Banister , MD Age: 53 Referring MD:  Date of Birth: [birth year] Gender: Female Account #: [id number] Procedure:                Colonoscopy Indications:              Screening for colorectal malignant neoplasm Medicines:                Monitored Anesthesia Care Procedure:                Pre-Anesthesia Assessment:                           - Prior to the procedure, a History and Physical                            was performed, and patient medications and                            allergies were reviewed. The patient's tolerance of                            previous anesthesia was also reviewed. The risks                            and benefits of the procedure and the sedation                            options and risks were discussed with the patient.                            All questions were answered, and informed consent                            was obtained. Prior Anticoagulants: The patient has                            taken no previous anticoagulant or antiplatelet                            agents. ASA Grade Assessment: II - A patient with                            mild systemic disease. After reviewing the risks                            and benefits, the patient was deemed in                            satisfactory condition to undergo the procedure.                           After obtaining informed consent, the colonoscope  was passed under direct vision. Throughout the                            procedure, the patient's blood pressure, pulse, and                            oxygen saturations were monitored continuously. The                            Colonoscope was introduced through the anus and                            advanced to the the cecum, identified by                            appendiceal orifice and  ileocecal valve. The                            colonoscopy was performed without difficulty. The                            patient tolerated the procedure well. The quality                            of the bowel preparation was excellent. The                            ileocecal valve, appendiceal orifice, and rectum                            were photographed. Scope In: 9:55:44 AM Scope Out: 10:06:51 AM Scope Withdrawal Time: 0 hours 8 minutes 45 seconds  Total Procedure Duration: 0 hours 11 minutes 7 seconds  Findings:                 The entire examined colon appeared normal on direct                            and retroflexion views. Complications:            No immediate complications. Estimated blood loss:                            None. Estimated Blood Loss:     Estimated blood loss: none. Impression:               - The entire examined colon is normal on direct and                            retroflexion views.                           - No polyps or cancers. Recommendation:           - Patient has a contact number available for  emergencies. The signs and symptoms of potential                            delayed complications were discussed with the                            patient. Return to normal activities tomorrow.                            Written discharge instructions were provided to the                            patient.                           - Resume previous diet.                           - Continue present medications.                           - Repeat colonoscopy in 10 years for screening. Rachael Feeaniel P Jacobs, MD 01/18/2019 10:08:50 AM This report has been signed electronically.

## 2019-01-22 ENCOUNTER — Telehealth: Payer: Self-pay | Admitting: *Deleted

## 2019-01-22 NOTE — Telephone Encounter (Signed)
  Follow up Call-  Call back number 01/18/2019  Post procedure Call Back phone  # [phone]  Permission to leave phone message Yes  Some recent data might be hidden     Patient questions:  Do you have a fever, pain , or abdominal swelling? No. Pain Score  0 *  Have you tolerated food without any problems? Yes.    Have you been able to return to your normal activities? Yes.    Do you have any questions about your discharge instructions: Diet   No. Medications  No. Follow up visit  No.  Do you have questions or concerns about your Care? No.  Actions: * If pain score is 4 or above: No action needed, pain <4.  Covid-19 screening questions   Do you now or have you had a fever in the last 14 days? no  Do you have any respiratory symptoms of shortness of breath or cough now or in the last 14 days? no  Do you have any family members or close contacts with diagnosed or suspected Covid-19 in the past 14 days? no  Have you been tested for Covid-19 and found to be positive? no

## 2019-12-24 ENCOUNTER — Other Ambulatory Visit: Payer: Self-pay | Admitting: Obstetrics and Gynecology

## 2019-12-24 DIAGNOSIS — R928 Other abnormal and inconclusive findings on diagnostic imaging of breast: Secondary | ICD-10-CM

## 2020-01-03 ENCOUNTER — Ambulatory Visit
Admission: RE | Admit: 2020-01-03 | Discharge: 2020-01-03 | Disposition: A | Discharge status: Home / Self Care | Payer: BC Managed Care – PPO | Source: Ambulatory Visit | Attending: Obstetrics and Gynecology | Admitting: Obstetrics and Gynecology

## 2020-01-03 ENCOUNTER — Other Ambulatory Visit: Payer: Self-pay

## 2020-01-03 ENCOUNTER — Other Ambulatory Visit: Payer: Self-pay | Admitting: Obstetrics and Gynecology

## 2020-01-03 DIAGNOSIS — R928 Other abnormal and inconclusive findings on diagnostic imaging of breast: Secondary | ICD-10-CM

## 2020-01-03 DIAGNOSIS — R921 Mammographic calcification found on diagnostic imaging of breast: Secondary | ICD-10-CM

## 2020-01-24 ENCOUNTER — Other Ambulatory Visit: Payer: Self-pay

## 2020-01-24 ENCOUNTER — Ambulatory Visit
Admission: RE | Admit: 2020-01-24 | Discharge: 2020-01-24 | Disposition: A | Discharge status: Home / Self Care | Payer: BC Managed Care – PPO | Source: Ambulatory Visit | Attending: Obstetrics and Gynecology | Admitting: Obstetrics and Gynecology

## 2020-01-24 DIAGNOSIS — R921 Mammographic calcification found on diagnostic imaging of breast: Secondary | ICD-10-CM

## 2024-05-30 ENCOUNTER — Other Ambulatory Visit: Payer: Self-pay | Admitting: Obstetrics and Gynecology

## 2024-05-30 DIAGNOSIS — R928 Other abnormal and inconclusive findings on diagnostic imaging of breast: Secondary | ICD-10-CM

## 2024-06-14 ENCOUNTER — Ambulatory Visit
Admission: RE | Admit: 2024-06-14 | Discharge: 2024-06-14 | Disposition: A | Discharge status: Home / Self Care | Source: Ambulatory Visit | Attending: Obstetrics and Gynecology | Admitting: Obstetrics and Gynecology

## 2024-06-14 DIAGNOSIS — R928 Other abnormal and inconclusive findings on diagnostic imaging of breast: Secondary | ICD-10-CM
